# Patient Record
Sex: Female | Born: 1990 | Race: Black or African American | Hispanic: No | Marital: Married
Health system: Southern US, Community
[De-identification: ages and names within clinical notes are randomized; demographics above are authoritative.]

---

## 2012-05-09 ENCOUNTER — Emergency Department: Payer: Self-pay | Admitting: Emergency Medicine

## 2012-05-09 LAB — CBC
HCT: 37.9 % (ref 35.0–47.0)
HGB: 12.8 g/dL (ref 12.0–16.0)
MCH: 25.6 pg — ABNORMAL LOW (ref 26.0–34.0)
MCV: 76 fL — ABNORMAL LOW (ref 80–100)
Platelet: 226 10*3/uL (ref 150–440)
RBC: 4.98 10*6/uL (ref 3.80–5.20)
RDW: 13.2 % (ref 11.5–14.5)
WBC: 14.2 10*3/uL — ABNORMAL HIGH (ref 3.6–11.0)

## 2012-05-09 LAB — COMPREHENSIVE METABOLIC PANEL
Albumin: 3.6 g/dL (ref 3.4–5.0)
Alkaline Phosphatase: 67 U/L (ref 50–136)
BUN: 8 mg/dL (ref 7–18)
Calcium, Total: 9.7 mg/dL (ref 8.5–10.1)
Chloride: 106 mmol/L (ref 98–107)
Creatinine: 0.64 mg/dL (ref 0.60–1.30)
EGFR (African American): >60
Glucose: 85 mg/dL (ref 65–99)
Potassium: 3.9 mmol/L (ref 3.5–5.1)
SGPT (ALT): 22 U/L (ref 12–78)
Total Protein: 7.6 g/dL (ref 6.4–8.2)

## 2012-05-09 LAB — URINALYSIS, COMPLETE
Blood: NEGATIVE
Cellular Cast: 10
Glucose,UR: NEGATIVE mg/dL (ref 0–75)
Ketone: NEGATIVE
Nitrite: NEGATIVE
Ph: 6 (ref 4.5–8.0)
Protein: 100
RBC,UR: 4 /HPF (ref 0–5)
Transitional Epi: <1

## 2012-07-15 ENCOUNTER — Emergency Department: Payer: Self-pay | Admitting: Emergency Medicine

## 2012-07-16 LAB — CBC
HGB: 9.5 g/dL — ABNORMAL LOW (ref 12.0–16.0)
MCV: 76 fL — ABNORMAL LOW (ref 80–100)
Platelet: 229 10*3/uL (ref 150–440)
RBC: 3.6 10*6/uL — ABNORMAL LOW (ref 3.80–5.20)
RDW: 14.7 % — ABNORMAL HIGH (ref 11.5–14.5)
WBC: 9.7 10*3/uL (ref 3.6–11.0)

## 2012-07-16 LAB — URINALYSIS, COMPLETE
Bilirubin,UR: NEGATIVE
Glucose,UR: NEGATIVE mg/dL (ref 0–75)
Ketone: NEGATIVE
Nitrite: POSITIVE
Ph: 6 (ref 4.5–8.0)
Protein: NEGATIVE
Specific Gravity: 1.01 (ref 1.003–1.030)
Squamous Epithelial: 6
WBC UR: 3 /HPF (ref 0–5)

## 2012-07-16 LAB — HCG, QUANTITATIVE, PREGNANCY: Beta Hcg, Quant.: 43440 m[IU]/mL — ABNORMAL HIGH

## 2012-07-16 LAB — GC/CHLAMYDIA PROBE AMP

## 2012-07-16 LAB — BASIC METABOLIC PANEL
BUN: 7 mg/dL (ref 7–18)
Chloride: 105 mmol/L (ref 98–107)
EGFR (African American): >60

## 2012-07-18 LAB — URINE CULTURE

## 2012-09-19 ENCOUNTER — Observation Stay: Payer: Self-pay | Admitting: Obstetrics and Gynecology

## 2012-09-19 LAB — DRUG SCREEN, URINE
Cocaine Metabolite,Ur ~~LOC~~: NEGATIVE (ref ?–300)
MDMA (Ecstasy)Ur Screen: NEGATIVE (ref ?–500)
Methadone, Ur Screen: NEGATIVE (ref ?–300)

## 2012-09-21 ENCOUNTER — Inpatient Hospital Stay: Payer: Self-pay | Admitting: Obstetrics and Gynecology

## 2012-09-21 ENCOUNTER — Observation Stay: Payer: Self-pay | Admitting: Obstetrics and Gynecology

## 2012-09-21 LAB — DRUG SCREEN, URINE
Amphetamines, Ur Screen: NEGATIVE (ref ?–1000)
Barbiturates, Ur Screen: NEGATIVE (ref ?–200)
MDMA (Ecstasy)Ur Screen: NEGATIVE (ref ?–500)
Methadone, Ur Screen: NEGATIVE (ref ?–300)
Opiate, Ur Screen: NEGATIVE (ref ?–300)
Tricyclic, Ur Screen: NEGATIVE (ref ?–1000)

## 2012-09-21 LAB — CBC WITH DIFFERENTIAL/PLATELET
Eosinophil #: 0.1 10*3/uL (ref 0.0–0.7)
HCT: 26.7 % — ABNORMAL LOW (ref 35.0–47.0)
HGB: 9.3 g/dL — ABNORMAL LOW (ref 12.0–16.0)
MCH: 27.3 pg (ref 26.0–34.0)
MCHC: 35 g/dL (ref 32.0–36.0)
MCV: 78 fL — ABNORMAL LOW (ref 80–100)
Monocyte #: 0.8 x10 3/mm (ref 0.2–0.9)
Monocyte %: 7 %
Platelet: 234 10*3/uL (ref 150–440)
WBC: 10.8 10*3/uL (ref 3.6–11.0)

## 2012-09-21 LAB — URINALYSIS, COMPLETE
Bilirubin,UR: NEGATIVE
Glucose,UR: NEGATIVE mg/dL (ref 0–75)
Ketone: NEGATIVE
Ph: 6 (ref 4.5–8.0)
Protein: 30
RBC,UR: 1 /HPF (ref 0–5)
Specific Gravity: 1.018 (ref 1.003–1.030)
Squamous Epithelial: 9
WBC UR: 23 /HPF (ref 0–5)

## 2012-09-22 LAB — CBC WITH DIFFERENTIAL/PLATELET
Basophil #: 0 10*3/uL (ref 0.0–0.1)
Eosinophil #: 0.1 10*3/uL (ref 0.0–0.7)
HCT: 29.2 % — ABNORMAL LOW (ref 35.0–47.0)
HGB: 10 g/dL — ABNORMAL LOW (ref 12.0–16.0)
Lymphocyte %: 13.3 %
MCH: 26.7 pg (ref 26.0–34.0)
MCHC: 34.4 g/dL (ref 32.0–36.0)
Neutrophil #: 10.1 10*3/uL — ABNORMAL HIGH (ref 1.4–6.5)
RBC: 3.76 10*6/uL — ABNORMAL LOW (ref 3.80–5.20)

## 2012-09-23 LAB — URINE CULTURE

## 2012-09-25 LAB — PATHOLOGY REPORT

## 2012-09-27 LAB — AEROBIC CULTURE

## 2012-09-27 LAB — MISC AER/ANAEROBIC CULT.

## 2012-09-28 LAB — MISC AER/ANAEROBIC CULT.

## 2014-06-02 NOTE — H&P (Signed)
L&D Evaluation:  History:  HPI 24 yo G2P0010 at 8849w1d gestation by D=10wk US derived EDC of 12/20/2012, presenting with decreased fetal movement.  She was seen in clinic on 08/19/2012 for the same concern on 08/19/2012 a brief bedside US was conducted, the fetus appeared appropriately grown with subjectively normal fluid by no fetal movement was observed by the examiner which prompted the patient being sent to L&D for further monitoring.  On L&D the fetal heart rate tracing was interpreted as categoy I and the patient was reassured and discharged home with follow up within the week.  She called the on call physician yesterday evening and reported no fetal movement, however secondary to transportation issues she did not present until the following afternoon.  Bedside ultrasound earlier today confirmed IUFD.  Patient went home and represents this evening to start induction of labor.  [facility] is [facility] for entry to care at 6 weeks 3 days gestation.  Patient had an E. Coli UTI on initial screening culture, has sickle cell trait, and was seen for some bleeding at 18 weeks with normal anatomy US at 20 weeks showing an anterior placenta and otherwise normal fetal anatomy.  She had a brief lapse of care from 1490w0d to 6031w6d at which time she represented with decreased fetal movement.  PNL O positive / ABSC neg / RI / VZI / HBsAg neg / RPR NR / HIV neg / H&H 11.5 & 34.6 (MCV 78) / Hgb electrophoresis A S / urine culture 100,000 CFU E. Coli / no 1st trimester or 2nd trimester genetic screening.  Normotensive throughout pregnancy, appropriated 19lbs weight gain since initiating care   Presents with decreased fetal movement   Patient's Medical History Sickle cell trait   Patient's Surgical History none   Medications Pre Natal Vitamins   Allergies NKDA   Social History none   Family History Non-Contributory   ROS:  ROS All systems were reviewed.  HEENT, CNS, GI, GU,  Respiratory, CV, Renal and Musculoskeletal systems were found to be normal.   Exam:  Vital Signs stable   Urine Protein not completed   General grieving appropriately   Mental Status clear   Chest no increased work of breathing   Abdomen gravid, non-tender   Estimated Fetal Weight Average for gestational age   Fetal Position vtx   Edema no edema   FHT No fetal heartones   Ucx absent   Impression:  Impression 24 yo G2P0010 at 27 weeks 1 day gestation with IUFD   Plan:  Comments 1) IUFD - condolensces offered, discused that at present I do not have an identifiable etiology. Discussed work up to identify etiolgoy would involve gross physical exam, autopsy if the patient desires, cytogenetics, evalution for infections, and hypercoaguable work. Should these be unable to identify an etiology of abnormality the most likely diagnosis would be cord accident.  I discussed with the patient proceeding with induction of labor vs allowing the patient sometime to come to terms with the diagnosis, and allow her to gather her support group to help her through this.  The father of the baby, his mother, and several friend were at the patient bedside.  The patient has ultimatley decided to go home for a brief period of time and gather herself and wishes to proceed with induction of labor later this evening.  Will proceed with initial laboratory evalution - TSH - CBC - T&S - KB - UDS - Lupus anticoagulant - MTHFR - Anticardiolipin  antibodies - Factor V leiden - Prothrombin gene mutation - Antithrombin III - Toxoplasmosis Ab - RPR  - parvo B19 antibodies  Following delivery will obtain cytogenetics, autopsy, as well as placental culture if patient desires.  In addition once the patient is somewhat more postpartum will consider protein C and S as these can values are not accurate antepartum.  2) IOL to proceed via placement of 200mcg of cytotec every 4-6hrs   Follow Up Appointment  already scheduled   Electronic Signatures for Addendum Section:  Lorrene ReidStaebler, Allannah Kempen M (MD) (Signed Addendum [phone]-Aug-14 22:23)  Female fetus - baby Rain   Electronic Signatures: Lorrene ReidStaebler, Yovanny Coats M (MD)  (Signed 30-Aug-14 21:53)  Authored: L&D Evaluation   Last Updated: 30-Aug-14 22:23 by Lorrene ReidStaebler, Ronel Rodeheaver M (MD)

## 2014-06-02 NOTE — H&P (Signed)
L&D Evaluation:  History:  HPI 24 yo G2P0010 at 336w1d gestation by D=10wk US derived EDC of 12/20/2012, presenting with decreased fetal movement.  She was seen in clinic on 08/19/2012 for the same concern on 08/19/2012 a brief bedside US was conducted, the fetus appeared appropriately grown with subjectively normal fluid by no fetal movement was observed by the examiner which prompted the patient being sent to L&D for further monitoring.  On L&D the fetal heart rate tracing was interpreted as categoy I and the patient was reassured and discharged home with follow up within the week.  She called the on call physician yesterday evening and reported no fetal movement, however secondary to transportation issues she did not present until the following afternoon.  Bedside ultrasound today confirms IUFD.  [facility] is [facility] for entry to care at 6 weeks 3 days gestation.  Patient had an E. Coli UTI on initial screening culture, has sickle cell trait, and was seen for some bleeding at 18 weeks with normal anatomy US at 20 weeks showing an anterior placenta and otherwise normal fetal anatomy.  She had a brief lapse of care from 5556w0d to 6866w6d at which time she represented with decreased fetal movement.  PNL O positive / ABSC neg / RI / VZI / HBsAg neg / RPR NR / HIV neg / H&H 11.5 & 34.6 (MCV 78) / Hgb electrophoresis A S / urine culture 100,000 CFU E. Coli / no 1st trimester or 2nd trimester genetic screening.  Normotensive throughout pregnancy, appropriated 19lbs weight gain since initiating care   Presents with decreased fetal movement   Patient's Medical History Sickle cell trait   Patient's Surgical History none    Medications Pre Natal Vitamins   Allergies NKDA   Social History none    Family History Non-Contributory    ROS:  ROS All systems were reviewed.  HEENT, CNS, GI, GU, Respiratory, CV, Renal and Musculoskeletal systems were found to be normal.   Exam:   Vital Signs stable    Urine Protein not completed   General grieving appropriately   Mental Status clear    Chest no increased work of breathing    Abdomen gravid, non-tender   Estimated Fetal Weight Average for gestational age   Fetal Position vtx   Edema no edema    FHT No fetal heartones   Ucx absent   Other Bedside ultrasound performed after no fetal heartones were able to be obtained by nursing staff.  Bedside US reveals singleton IUP, vertex presentation, anterior placenta, with subjectively normal fluid. No FHT's were seen, this was confirmed by colorflow imaging of the heart and umbilical cord.   Impression:  Impression 24 yo G2P0010 at 27 weeks 1 day gestation with IUFD   Plan:  Comments 1) IUFD - condolensces offered, discused that at present I do not have an identifiable etiology. Discussed work up to identify etiolgoy would involve gross physical exam, autopsy if the patient desires, cytogenetics, evalution for infections, and hypercoaguable work. Should these be unable to identify an etiology of abnormality the most likely diagnosis would be cord accident.  I discussed with the patient proceeding with induction of labor vs allowing the patient sometime to come to terms with the diagnosis, and allow her to gather her support group to help her through this.  The father of the baby, his mother, and several friend were at the patient bedside.  The patient has ultimatley decided to go home for a brief  period of time and gather herself and wishes to proceed with induction of labor later today.  We also discussed rational of moving towards vaginal delivery vs C-section althgouh the later may seem quicker and less emotionally painfull it poses greater risks for the patient and does have implications for future pregnancies.   2) Disposition - discharge home with follow up later tonight for cytotec induction of labor   Follow Up Appointment already scheduled   Electronic  Signatures: Lorrene ReidStaebler, Sheilla Maris M (MD)  (Signed 30-Aug-14 18:11)  Authored: L&D Evaluation   Last Updated: 30-Aug-14 18:11 by Lorrene ReidStaebler, Kaleea Penner M (MD)

## 2014-06-02 NOTE — H&P (Signed)
L&D Evaluation:  History Expanded:  HPI G2P0 at 27 weeks sent from office since no fetal movement seen for 15 minutes on US, the pt states baby not moving much but upon arrival to land d after about 15 minutes of a very flst trscing the fetus woke up and stsarted moving.Marland Kitchen. and the tracing became a CAT 1 strctly reactive with fair STv, the fetus on US looks very good on US except for the decreasedfetal movement. the trscing has remained CAT 1 while here and goes through sleep cycles where LTVis decreased. pt is a sickle cell trait carrier   Gravida 2   Term 0   PreTerm 0   Abortion 1   Living 0   Blood Type (Maternal) O positive   Group B Strep Results Maternal (Result >5wks must be treated as unknown) unknown/result > 5 weeks ago   Maternal HIV Negative   Maternal Syphilis Ab Nonreactive   Maternal Varicella Immune   Rubella Results (Maternal) immune   Maternal T-Dap Unknown   [facility]   Presents with decreased fetal movement   Patient's Medical History No Chronic Illness   Patient's Surgical History none   Medications Pre Natal Vitamins   Allergies NKDA   Social History none   Family History Non-Contributory   Current Prenatal Course Notable For sickle cell trait carrier.   ROS:  ROS All systems were reviewed.  HEENT, CNS, GI, GU, Respiratory, CV, Renal and Musculoskeletal systems were found to be normal.   Exam:  Vital Signs stable   Urine Protein not completed   General no apparent distress   Mental Status clear   Chest clear   Heart normal sinus rhythm   Abdomen gravid, non-tender   Estimated Fetal Weight Small for gestational age   Back no CVAT   Edema no edema   Pelvic no external lesions   Mebranes Intact   FHT flat at times when sleeping but then strictly reactive with fair stv and good long term variability   Ucx absent   Skin dry   Lymph no lymphadenopathy   Impression:  Impression decreased fetal movement    Plan:  Plan EFM/NST   Comments now that pt is reactive will dc home   Follow Up Appointment need to schedule. in 1 week   Electronic Signatures: Adria DevonKlett, Hikaru Delorenzo (MD)  (Signed 28-Aug-14 13:27)  Authored: L&D Evaluation   Last Updated: 28-Aug-14 13:27 by Adria DevonKlett, Adileny Delon (MD)

## 2014-08-10 IMAGING — US US OB < 14 WEEKS - US OB TV
1 series · 14 of 28 positions shown · non-contrast
Comparison: none

REASON FOR EXAM: abdominal pain
COMMENTS:

PROCEDURE:     US  - US OB LESS THAN 14 WEEKS/W TRANS  - May 09, 2012  [DATE]
RESULT:     Comparison: None.
TECHNIQUE: Multiple grayscale and color Doppler images were obtained of the
pelvis via transabdominal and endovaginal ultrasound.

[Series 1: us ob < 14 weeks - us ob tv · 0.25mm/px · 14 of 67 slices shown]
[im 3/67]
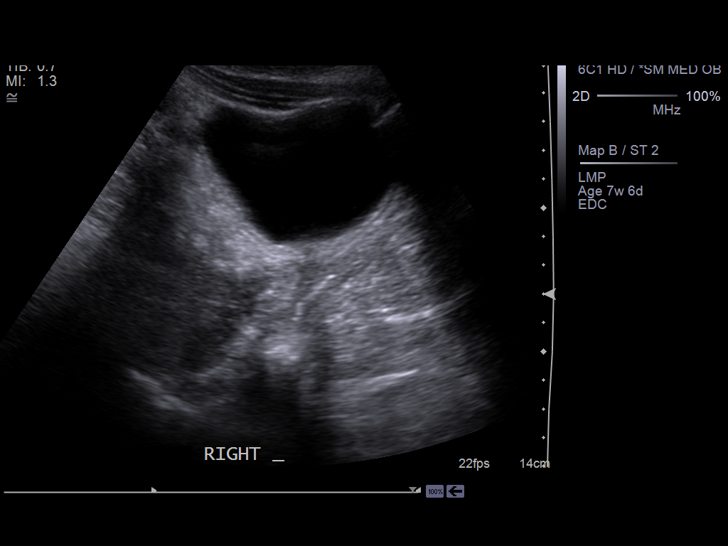
[im 8/67]
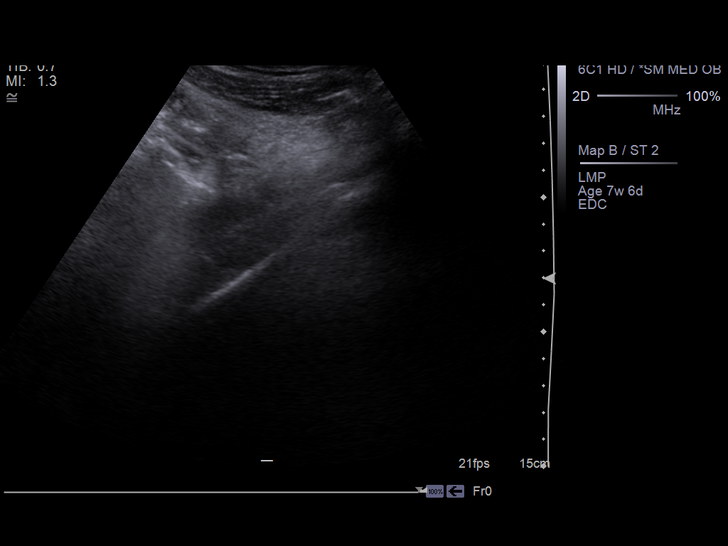
[im 13/67]
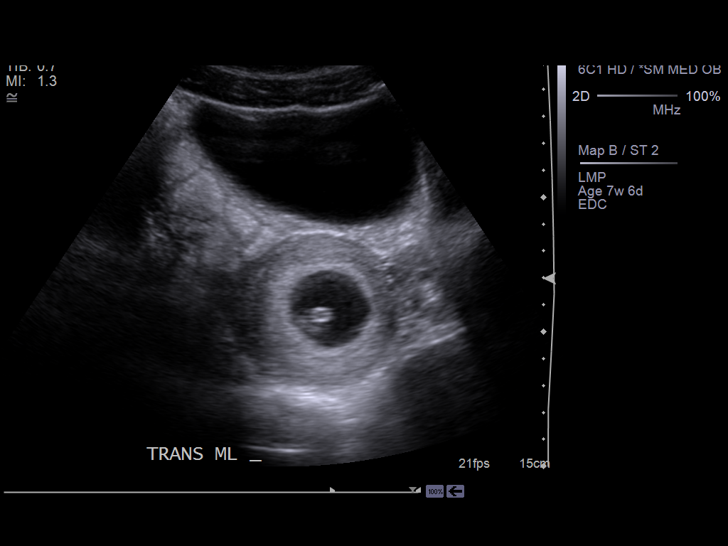
[im 18/67]
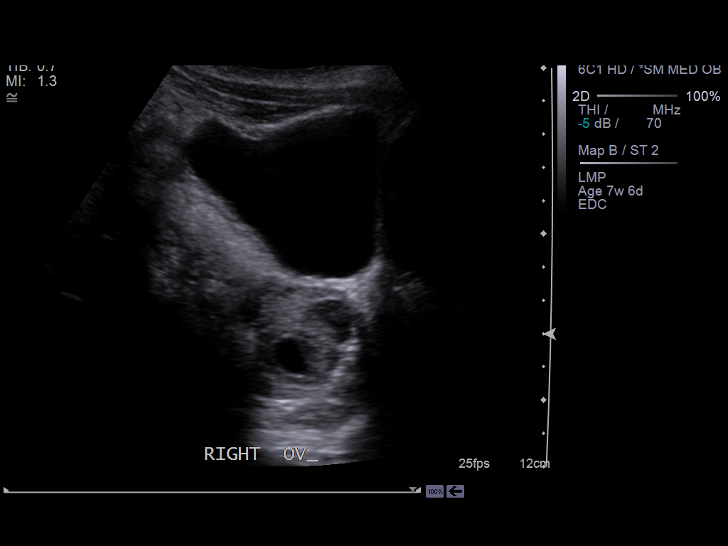
[im 23/67]
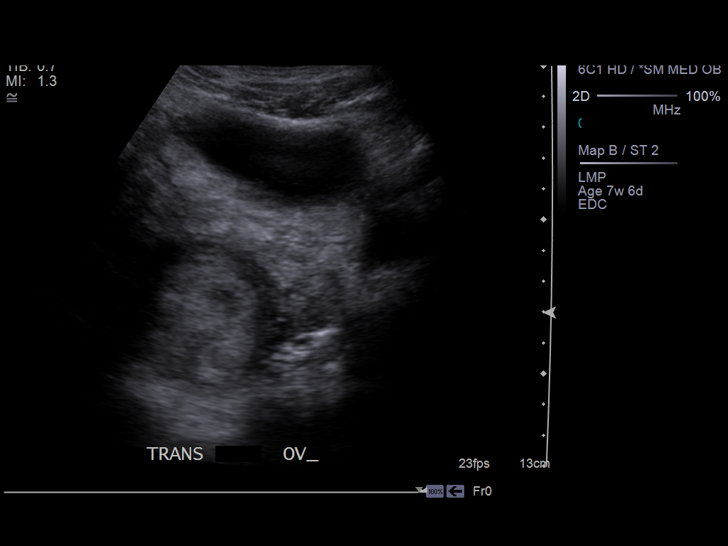
[im 27/67]
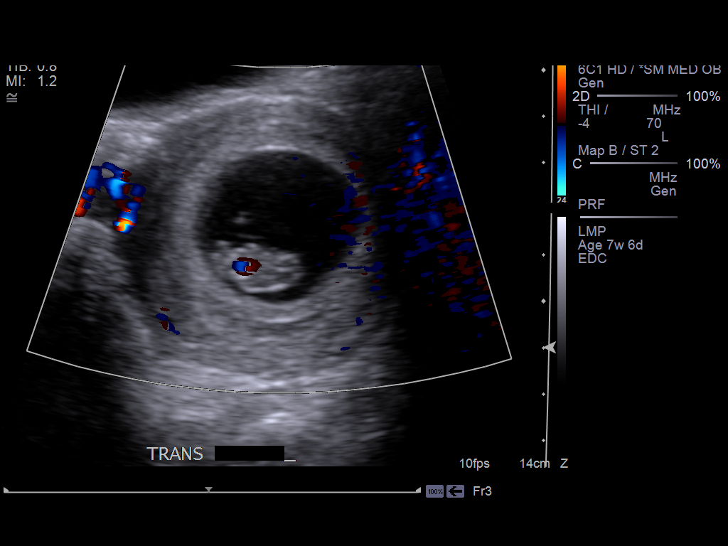
[im 32/67]
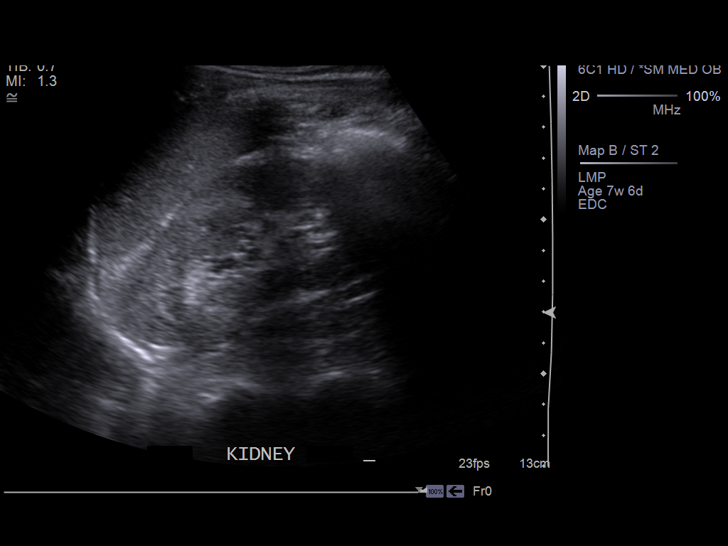
[im 37/67]
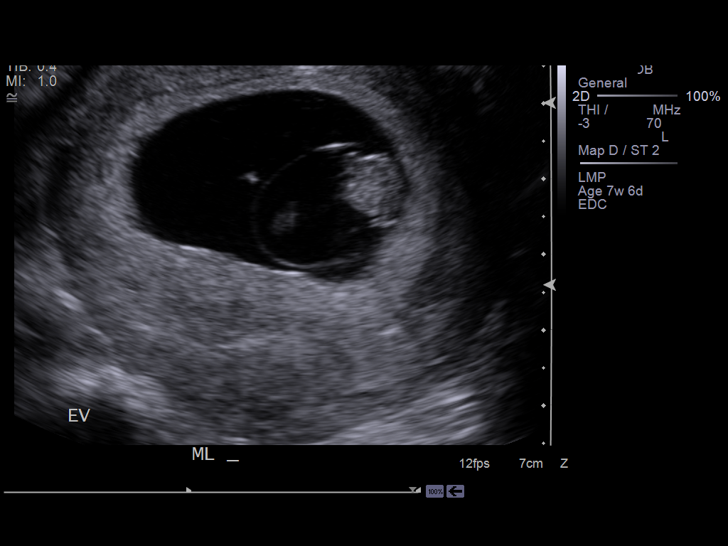
[im 42/67]
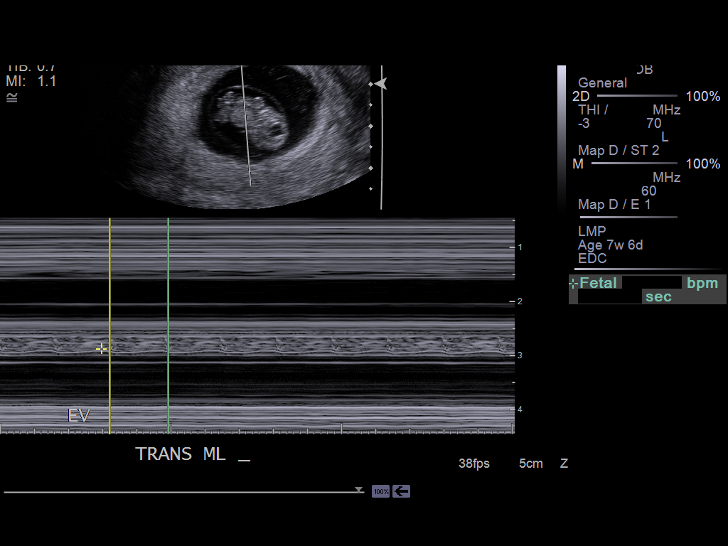
[im 47/67]
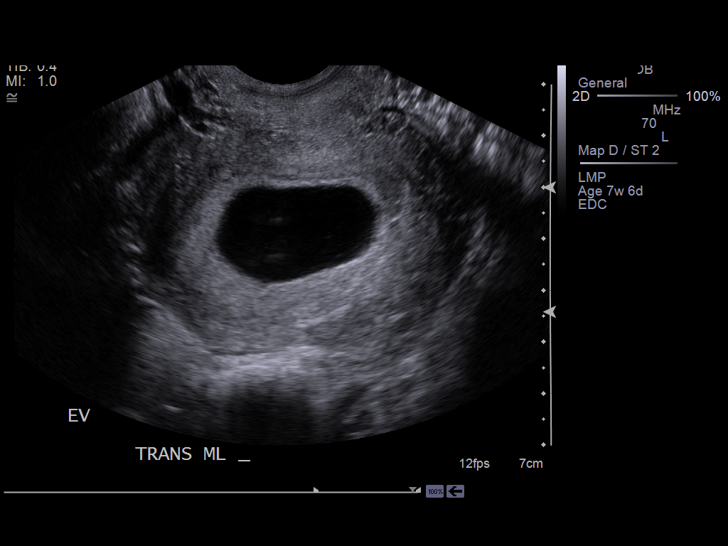
[im 52/67]
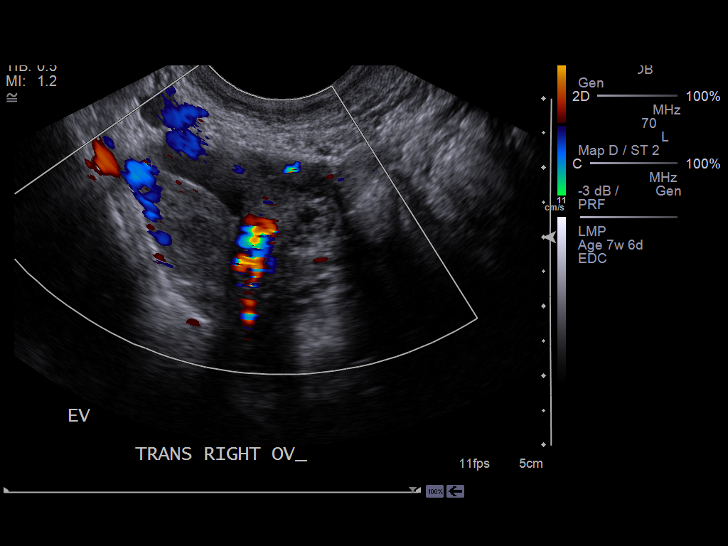
[im 57/67]
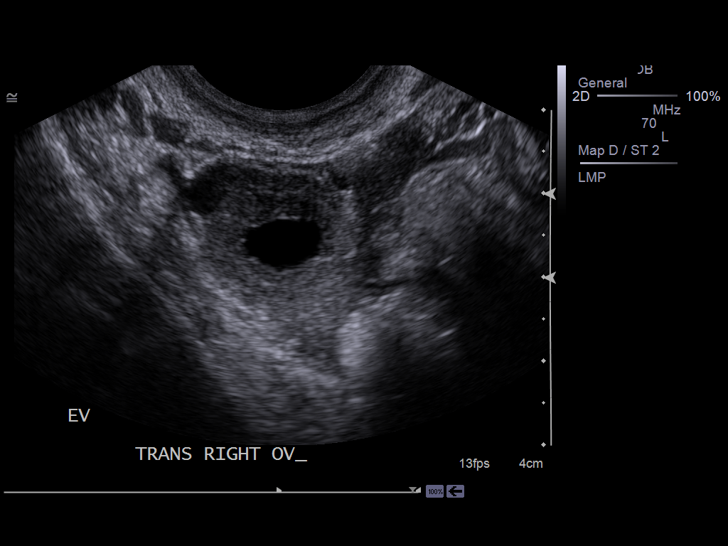
[im 62/67]
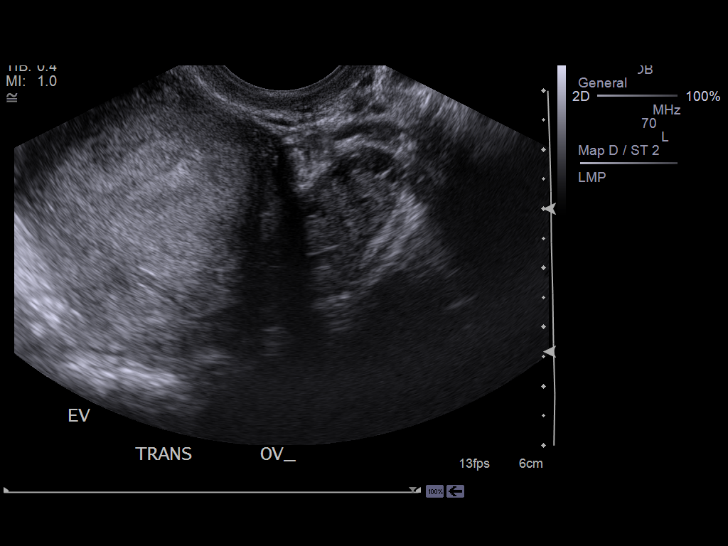
[im 67/67]
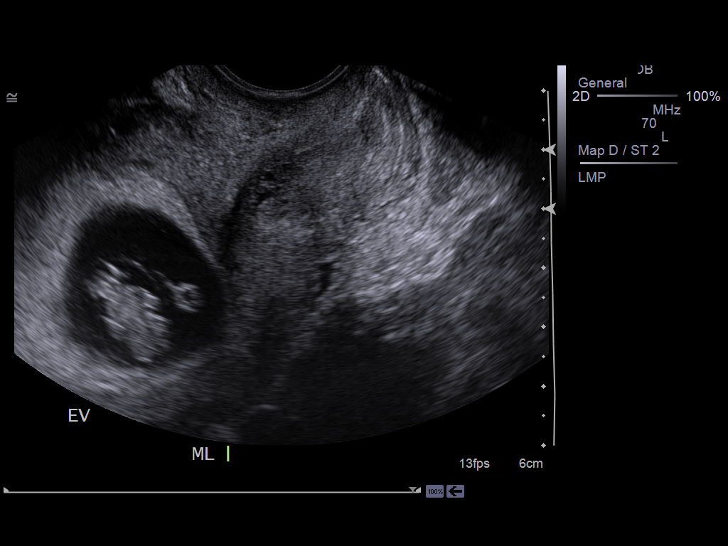

[14 of 28 positions shown; findings below may reference images not displayed]

FINDINGS: There is a gestational sac within the endometrial canal. There is a fetal
pole with a crown-rump length of 1.92 cm, which correlates with estimated
gestational age of 8 weeks 3 days. A yolk sac is present. The fetal heart
rate measures 175 beats per minute.

The right ovary measures 3.2 x 2.5 x 2.6 cm. There is a 2.0 cm complex solid
and cystic mass in the right ovary which likely represents a corpus luteum
cyst. There is a small, 1.5 cm cyst in the right ovary, as well. The left
ovary measures 2.7 x 2.8 x 1.4 cm. Color Doppler flow is associated with the
bilateral ovaries. Spectral Doppler imaging was not performed.
IMPRESSION: Single live intrauterine pregnancy with estimated gestational age of 8 weeks
3 days.

[REDACTED]

## 2014-10-17 IMAGING — US US OB LIMITED
1 series · 14 of 28 positions shown · non-contrast
Comparison: none

REASON FOR EXAM: Fetal heart beat
COMMENTS:   May transport without cardiac monitor

[Series 1: us ob limited · 0.26mm/px · 39 acquisitions, 14 frames shown]
[im 2/39]
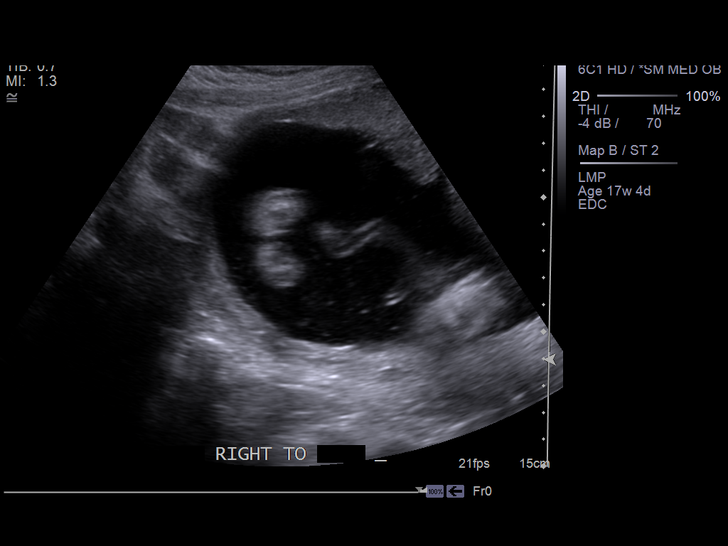
[im 5/39]
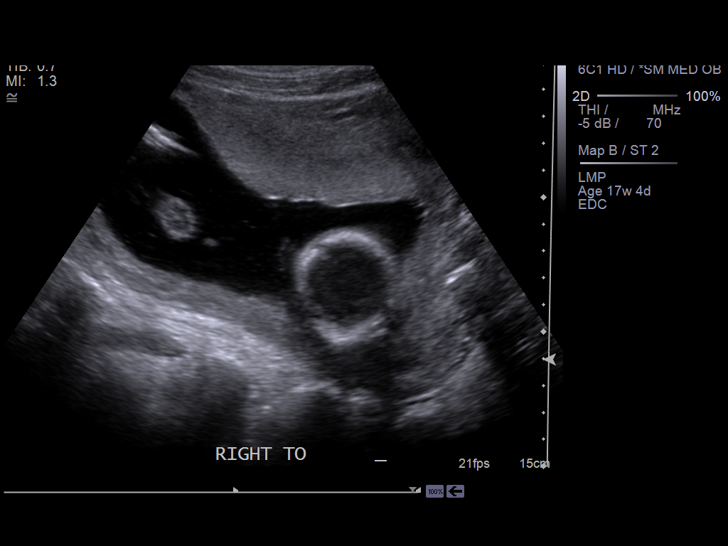
[im 8/39]
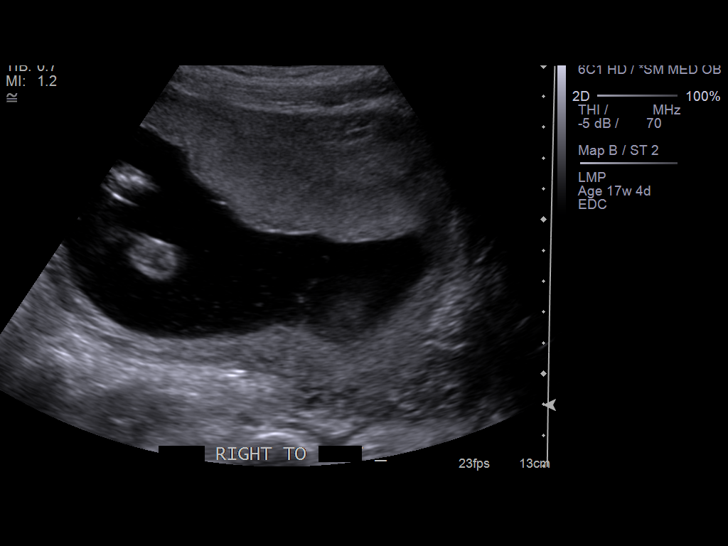
[im 10/39]
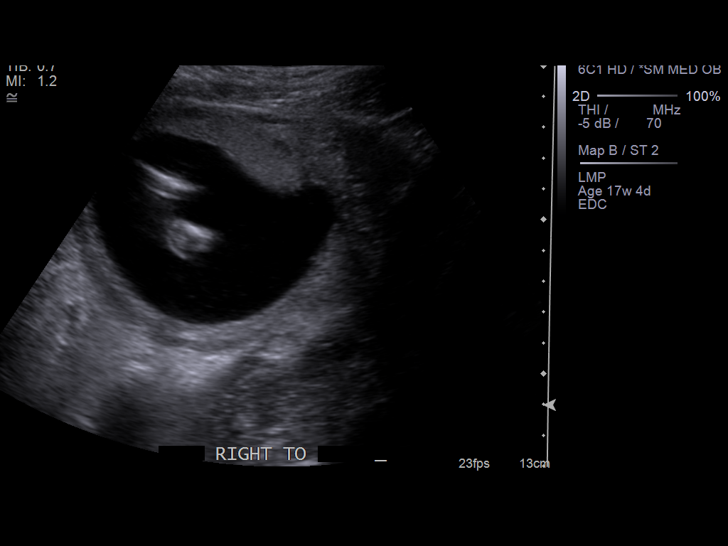
[im 13/39]
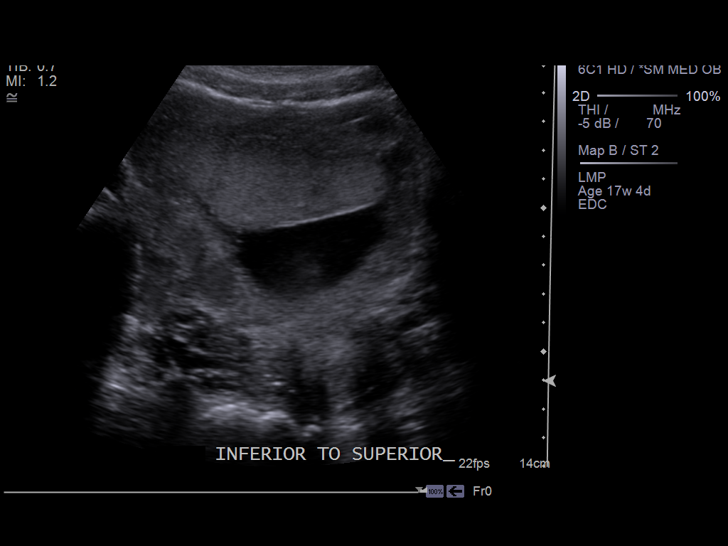
[im 16/39]
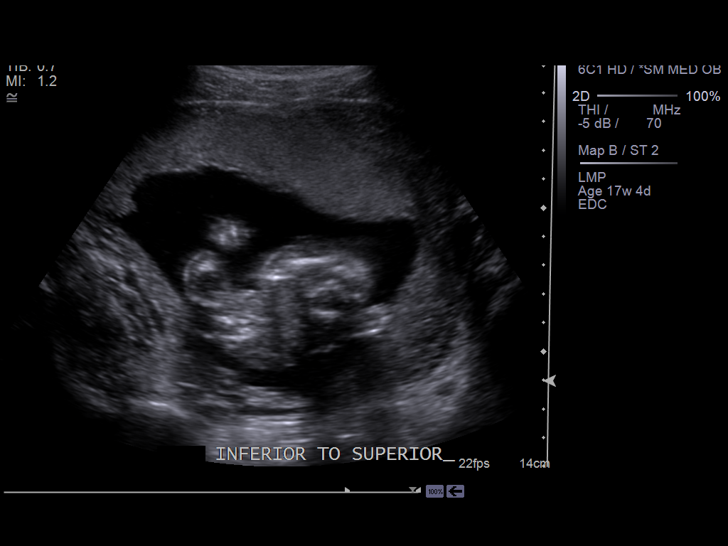
[im 19/39]
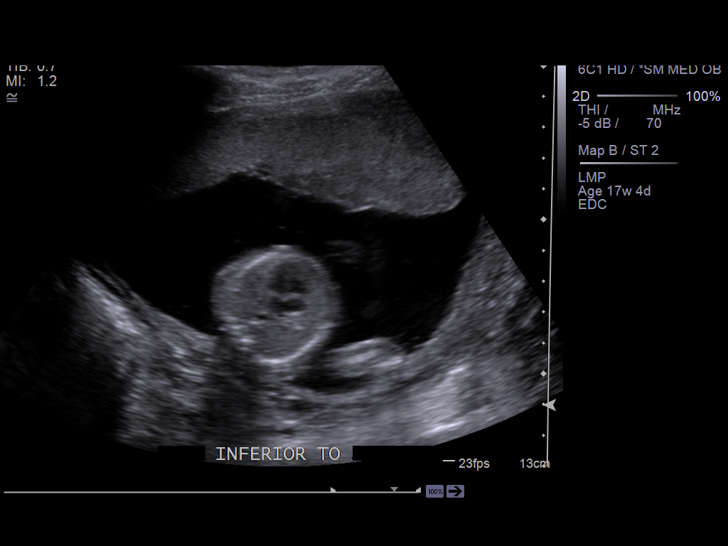
[im 22/39]
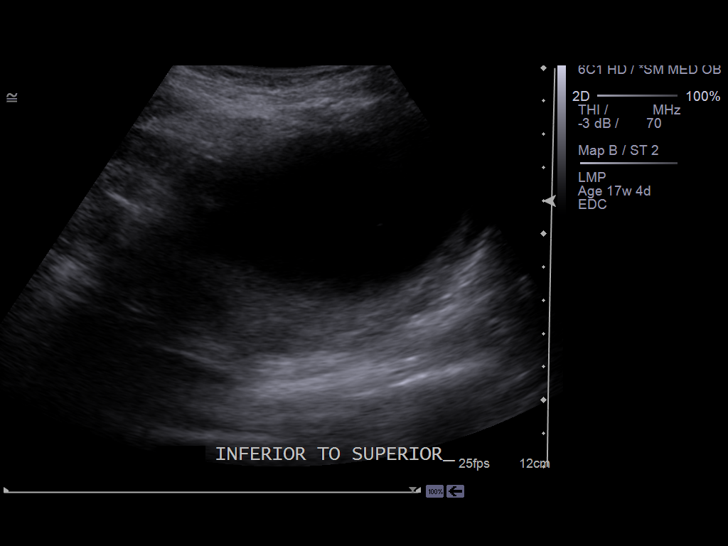
[im 24/39]
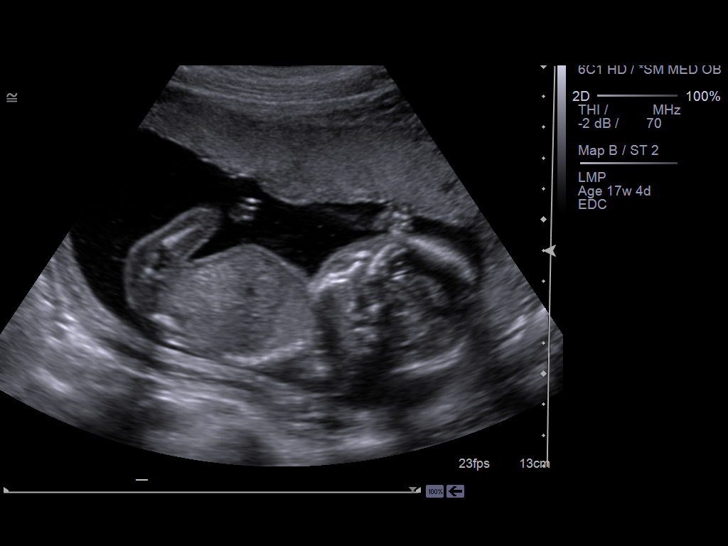
[im 27/39]
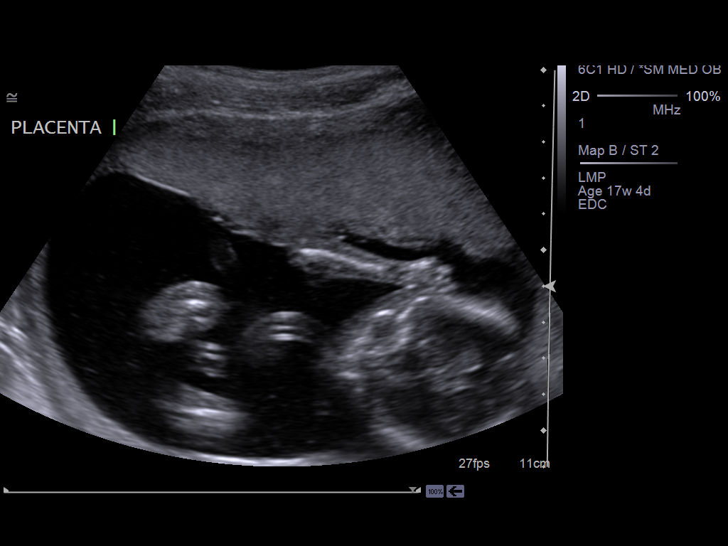
[im 30/39]
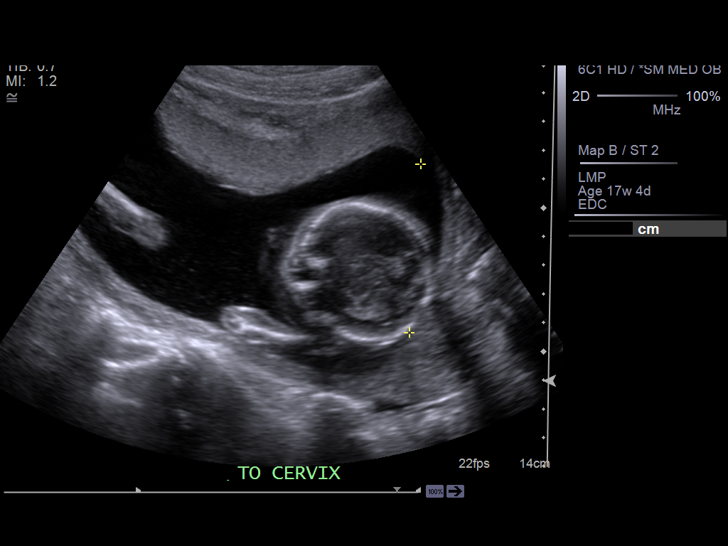
[im 33/39]
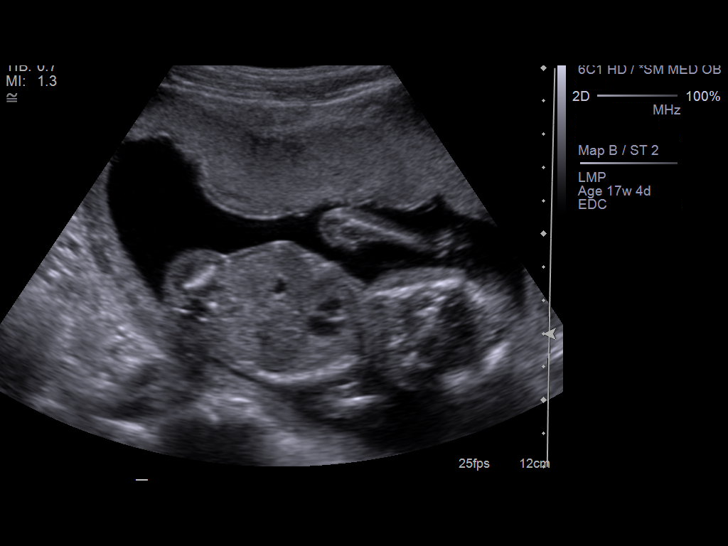
[im 36/39]
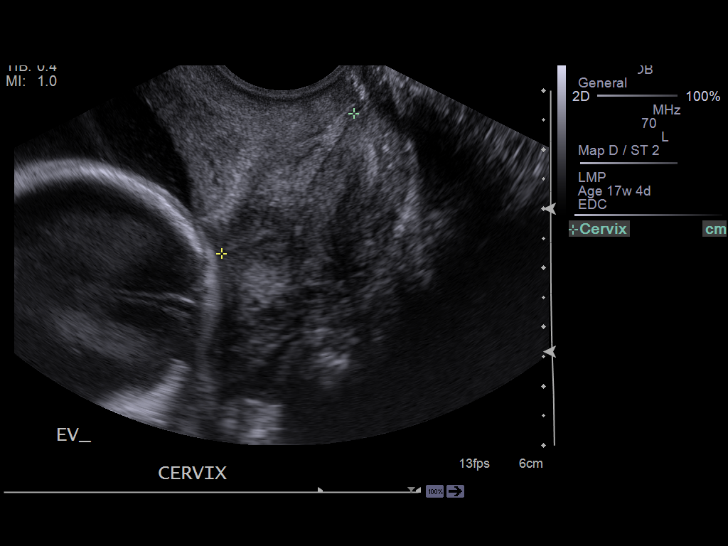
[im 39/39]
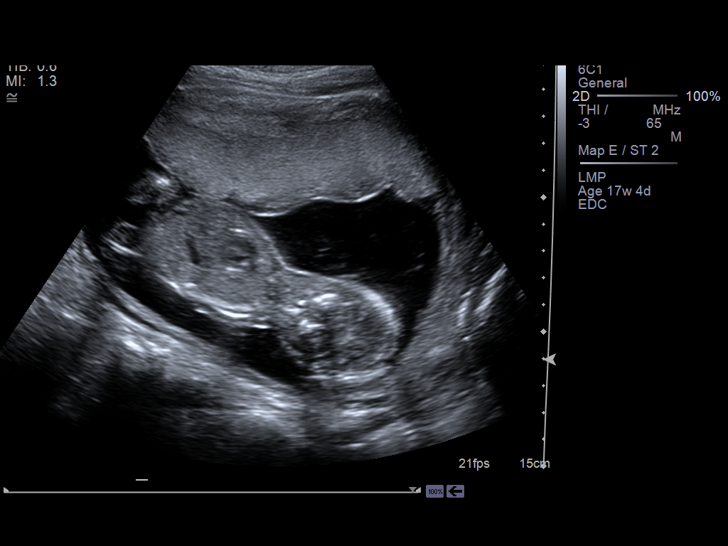

[14 of 28 positions shown; findings below may reference images not displayed]

PROCEDURE:     US  - US LIMITED OB  - July 16, 2012  [DATE]

RESULT:     A limited OB ultrasound was performed due to vaginal bleeding.

There is a gravid uterus with a viable fetus in cephalic presentation. The
fetal cardiac rate is 147 beats per minute. The amniotic fluid volume is
estimated to be normal. The placenta is anterior. The distance from the
lower placental segment to the internal cervical os is 5.9 cm. The internal
os is closed. There is no finding to suggest placental abruption.
IMPRESSION: There is a viable IUP with cardiac rate of 147 beats per
minute. The presentation is cephalic. The estimated gestational age is 17
weeks 4 days based on clinical parameters.

A preliminary report was sent to the [HOSPITAL] the conclusion
of the study.

[REDACTED]
